# Patient Record
Sex: Female | Born: 2000 | Race: Black or African American | Hispanic: No | Marital: Single | Smoking: Never smoker
Health system: Southern US, Community
[De-identification: ages and names within clinical notes are randomized; demographics above are authoritative.]

## PROBLEM LIST (undated history)

## (undated) ENCOUNTER — Inpatient Hospital Stay (HOSPITAL_COMMUNITY): Payer: Self-pay

## (undated) DIAGNOSIS — D649 Anemia, unspecified: Secondary | ICD-10-CM

## (undated) HISTORY — PX: NO PAST SURGERIES: SHX2092

---

## 2018-08-30 ENCOUNTER — Other Ambulatory Visit: Payer: Self-pay

## 2018-08-30 ENCOUNTER — Encounter (HOSPITAL_BASED_OUTPATIENT_CLINIC_OR_DEPARTMENT_OTHER): Payer: Self-pay | Admitting: Emergency Medicine

## 2018-08-30 ENCOUNTER — Emergency Department (HOSPITAL_BASED_OUTPATIENT_CLINIC_OR_DEPARTMENT_OTHER)
Admission: EM | Admit: 2018-08-30 | Discharge: 2018-08-31 | Disposition: A | Discharge status: Home / Self Care | Payer: Medicaid Other | Attending: Emergency Medicine | Admitting: Emergency Medicine

## 2018-08-30 DIAGNOSIS — O200 Threatened abortion: Secondary | ICD-10-CM | POA: Diagnosis not present

## 2018-08-30 DIAGNOSIS — O26851 Spotting complicating pregnancy, first trimester: Secondary | ICD-10-CM | POA: Diagnosis present

## 2018-08-30 LAB — CBC WITH DIFFERENTIAL/PLATELET
Abs Immature Granulocytes: 0.03 10*3/uL (ref 0.00–0.07)
BASOS PCT: 1 %
Basophils Absolute: 0.1 10*3/uL (ref 0.0–0.1)
EOS ABS: 0.6 10*3/uL — AB (ref 0.0–0.5)
EOS PCT: 6 %
HCT: 38 % (ref 36.0–46.0)
Hemoglobin: 11.8 g/dL — ABNORMAL LOW (ref 12.0–15.0)
Immature Granulocytes: 0 %
Lymphocytes Relative: 33 %
Lymphs Abs: 3.2 10*3/uL (ref 0.7–4.0)
MCH: 25.2 pg — ABNORMAL LOW (ref 26.0–34.0)
MCHC: 31.1 g/dL (ref 30.0–36.0)
MCV: 81 fL (ref 80.0–100.0)
MONO ABS: 0.9 10*3/uL (ref 0.1–1.0)
Monocytes Relative: 9 %
Neutro Abs: 4.9 10*3/uL (ref 1.7–7.7)
Neutrophils Relative %: 51 %
PLATELETS: 339 10*3/uL (ref 150–400)
RBC: 4.69 MIL/uL (ref 3.87–5.11)
RDW: 15 % (ref 11.5–15.5)
WBC: 9.7 10*3/uL (ref 4.0–10.5)
nRBC: 0 % (ref 0.0–0.2)

## 2018-08-30 LAB — URINALYSIS, MICROSCOPIC (REFLEX)

## 2018-08-30 LAB — BASIC METABOLIC PANEL
Anion gap: 10 (ref 5–15)
BUN: 9 mg/dL (ref 6–20)
CALCIUM: 9.2 mg/dL (ref 8.9–10.3)
CO2: 23 mmol/L (ref 22–32)
CREATININE: 0.66 mg/dL (ref 0.44–1.00)
Chloride: 106 mmol/L (ref 98–111)
GFR calc Af Amer: >60 mL/min (ref >60)
Glucose, Bld: 104 mg/dL — ABNORMAL HIGH (ref 70–99)
Potassium: 3.2 mmol/L — ABNORMAL LOW (ref 3.5–5.1)
SODIUM: 139 mmol/L (ref 135–145)

## 2018-08-30 LAB — URINALYSIS, ROUTINE W REFLEX MICROSCOPIC
Bilirubin Urine: NEGATIVE
GLUCOSE, UA: NEGATIVE mg/dL
KETONES UR: NEGATIVE mg/dL
Leukocytes, UA: NEGATIVE
Nitrite: POSITIVE — AB
PH: 6 (ref 5.0–8.0)
PROTEIN: NEGATIVE mg/dL
Specific Gravity, Urine: 1.025 (ref 1.005–1.030)

## 2018-08-30 LAB — HCG, QUANTITATIVE, PREGNANCY: HCG, BETA CHAIN, QUANT, S: 152 m[IU]/mL — AB (ref <5)

## 2018-08-30 NOTE — ED Triage Notes (Signed)
Patient states that she has had vaginal bleeding starting this am  - reports that she has used 2 pads. Patient states that she may have been [redacted] weeks along done by a home pregnancy test. The patient reports that intermittent cramping

## 2018-08-30 NOTE — ED Provider Notes (Signed)
[facility] EMERGENCY DEPARTMENT Provider Note   CSN: [id number] Arrival date & time: 08/30/18  2209     History   Chief Complaint Chief Complaint  Patient presents with  . Vaginal Bleeding    HPI Shannon Costa is a 17 y.o. female.  Patient is a 17 year old female with no significant past medical history.  She is G1 at which she believes to be [redacted] weeks gestation (last menstrual period September 14).  She presents today with complaints of vaginal bleeding.  She noticed this this morning upon wakening.  She has noticed steady, slow trickle of blood since this morning.  She does report some lower abdominal cramping, but no severe discomfort.  She reports taking a home pregnancy test 2 days ago that was positive, however has not been seen by a physician.  The history is provided by the patient.  Vaginal Bleeding  Primary symptoms include vaginal bleeding. There has been no fever. This is a new problem. Episode onset: This morning. The problem occurs constantly. The problem has not changed since onset.She is pregnant. She has missed her period. Her LMP was weeks ago. Pertinent negatives include no diaphoresis, no abdominal pain and no light-headedness. She has tried nothing for the symptoms. The treatment provided no relief.    History reviewed. No pertinent past medical history.  There are no active problems to display for this patient.   History reviewed. No pertinent surgical history.   OB History    Gravida  1   Para      Term      Preterm      AB      Living        SAB      TAB      Ectopic      Multiple      Live Births               Home Medications    Prior to Admission medications   Not on File    Family History History reviewed. No pertinent family history.  Social History Social History   Tobacco Use  . Smoking status: Never Smoker  . Smokeless tobacco: Never Used  Substance Use Topics  . Alcohol use: Never    Frequency:  Never  . Drug use: Never     Allergies   Patient has no known allergies.   Review of Systems Review of Systems  Constitutional: Negative for diaphoresis.  Gastrointestinal: Negative for abdominal pain.  Genitourinary: Positive for vaginal bleeding.  Neurological: Negative for light-headedness.  All other systems reviewed and are negative.    Physical Exam Updated Vital Signs BP (!) 132/92 (BP Location: Left Arm)   Pulse 77   Temp 98.7 F (37.1 C) (Oral)   Resp 16   Ht 5\' 8"  (1.727 m)   Wt 64.9 kg   LMP 07/28/2018   SpO2 100%   BMI 21.74 kg/m   Physical Exam  Constitutional: She is oriented to person, place, and time. She appears well-developed and well-nourished. No distress.  HENT:  Head: Normocephalic and atraumatic.  Neck: Normal range of motion. Neck supple.  Cardiovascular: Normal rate and regular rhythm.  No murmur heard. Pulmonary/Chest: Effort normal and breath sounds normal. No stridor. No respiratory distress. She has no wheezes.  Abdominal: Soft. She exhibits no distension. There is no tenderness.  Neurological: She is alert and oriented to person, place, and time.  Skin: Skin is warm and dry. She is not diaphoretic.  Nursing note and vitals reviewed.    ED Treatments / Results  Labs (all labs ordered are listed, but only abnormal results are displayed) Labs Reviewed  BASIC METABOLIC PANEL  CBC WITH DIFFERENTIAL/PLATELET  HCG, QUANTITATIVE, PREGNANCY  URINALYSIS, ROUTINE W REFLEX MICROSCOPIC  ABO/RH    EKG None  Radiology No results found.  Procedures Procedures (including critical care time)  Medications Ordered in ED Medications - No data to display   Initial Impression / Assessment and Plan / ED Course  I have reviewed the triage vital signs and the nursing notes.  Pertinent labs & imaging results that were available during my care of the patient were reviewed by me and considered in my medical decision making (see chart for  details).  Patient presenting with complaints of vaginal bleeding that started earlier this morning.  She took a pregnancy test at home which was + 2 days ago.  She does report some lower abdominal cramping.  Her beta hCG is 152.  Ultrasound is not here to perform an ultrasound, however she does appear hemodynamically stable.  Her blood counts are adequate.  She will have an ultrasound performed in the morning to rule out ectopic.  Patient will be advised vaginal rest and is to follow-up in the women's outpatient clinic in 3 days for repeat hCG and reevaluation.  Final Clinical Impressions(s) / ED Diagnoses   Final diagnoses:  None    ED Discharge Orders    None       Geoffery Lyons, MD 08/31/18 [phone]

## 2018-08-31 ENCOUNTER — Other Ambulatory Visit (HOSPITAL_BASED_OUTPATIENT_CLINIC_OR_DEPARTMENT_OTHER): Payer: Medicaid Other

## 2018-08-31 ENCOUNTER — Ambulatory Visit (HOSPITAL_BASED_OUTPATIENT_CLINIC_OR_DEPARTMENT_OTHER)
Admission: RE | Admit: 2018-08-31 | Discharge: 2018-08-31 | Disposition: A | Discharge status: Home / Self Care | Payer: Medicaid Other | Source: Ambulatory Visit | Attending: Emergency Medicine | Admitting: Emergency Medicine

## 2018-08-31 ENCOUNTER — Other Ambulatory Visit (HOSPITAL_BASED_OUTPATIENT_CLINIC_OR_DEPARTMENT_OTHER): Payer: Self-pay | Admitting: Emergency Medicine

## 2018-08-31 DIAGNOSIS — Z3A00 Weeks of gestation of pregnancy not specified: Secondary | ICD-10-CM | POA: Diagnosis not present

## 2018-08-31 DIAGNOSIS — O209 Hemorrhage in early pregnancy, unspecified: Secondary | ICD-10-CM | POA: Diagnosis not present

## 2018-08-31 DIAGNOSIS — N939 Abnormal uterine and vaginal bleeding, unspecified: Secondary | ICD-10-CM

## 2018-08-31 NOTE — Discharge Instructions (Signed)
Return tomorrow at the given time for an ultrasound.  You need to follow-up with OB/GYN in the next 3 days.  The contact information for women's outpatient clinic has been provided in this discharge summary for you to call and make these arrangements.

## 2018-08-31 NOTE — ED Provider Notes (Signed)
Ultrasound results reviewed with patient.  Ultrasound does not demonstrate intrauterine pregnancy or ectopic pregnancy.  Her hCG last night was 152.  Patient will require a repeat hCG in 48 hours.  Discussed importance of following up with an OB/GYN doctor.  Patient was given referral to women's outpatient clinic by Dr. Judd Lien  last evening   Linwood Dibbles, MD 08/31/18 1415

## 2018-09-01 LAB — ABO/RH: ABO/RH(D): O POS

## 2018-09-04 ENCOUNTER — Ambulatory Visit: Payer: Medicaid Other

## 2018-09-06 ENCOUNTER — Inpatient Hospital Stay (HOSPITAL_COMMUNITY)
Admission: AD | Admit: 2018-09-06 | Discharge: 2018-09-06 | Disposition: A | Discharge status: Home / Self Care | Payer: Medicaid Other | Source: Ambulatory Visit | Attending: Obstetrics & Gynecology | Admitting: Obstetrics & Gynecology

## 2018-09-06 ENCOUNTER — Encounter (HOSPITAL_COMMUNITY): Payer: Self-pay | Admitting: *Deleted

## 2018-09-06 DIAGNOSIS — O200 Threatened abortion: Secondary | ICD-10-CM

## 2018-09-06 DIAGNOSIS — O039 Complete or unspecified spontaneous abortion without complication: Secondary | ICD-10-CM | POA: Insufficient documentation

## 2018-09-06 DIAGNOSIS — Z3A01 Less than 8 weeks gestation of pregnancy: Secondary | ICD-10-CM | POA: Diagnosis not present

## 2018-09-06 DIAGNOSIS — O209 Hemorrhage in early pregnancy, unspecified: Secondary | ICD-10-CM | POA: Diagnosis present

## 2018-09-06 LAB — HCG, QUANTITATIVE, PREGNANCY: HCG, BETA CHAIN, QUANT, S: 12 m[IU]/mL — AB (ref <5)

## 2018-09-06 NOTE — MAU Note (Addendum)
Shannon Costa is a 17 y.o. at [redacted]w[redacted]d here in MAU reporting: for follow up HCG. Was seen in the ED on 10/19 and was told to follow up. Patient states she was unable to make it to clinic 3 days later as instructed by the ED. Denies any vaginal bleeding or pain today. Reports that she is doing well. Discussed with Sam CNM. Patient will go to lobby and await lab draw.  Vitals:   09/06/18 1359  BP: 124/76  Pulse: 70  Resp: 16  Temp: 98.1 F (36.7 C)  SpO2: 100%

## 2018-09-06 NOTE — MAU Provider Note (Signed)
  History     CSN: [id number]  Arrival date and time: 09/06/18 1345   None     Chief Complaint  Patient presents with  . Follow-up   HPI  Shanoah A Swenor is a 17 y.o. G1P0 at [redacted]w[redacted]d with pregnancy of unknown viability. She reports to MAU for repeat Quant hCG. Patient reports recent history of heavy vaginal bleeding and bilateral low abdominal cramping, both resolved within the past 48 hours. Patient was seen at [facility] ED 08/31/18 for heavy vaginal bleeding in the setting of two positive home pregnancy tests. Denies vaginal bleeding, abdominal pain, abnormal vaginal discharge, SOB, fever, falls, or recent illness.    OB History    Gravida  1   Para      Term      Preterm      AB      Living        SAB      TAB      Ectopic      Multiple      Live Births              No past medical history on file.  No past surgical history on file.  No family history on file.  Social History   Tobacco Use  . Smoking status: Never Smoker  . Smokeless tobacco: Never Used  Substance Use Topics  . Alcohol use: Never    Frequency: Never  . Drug use: Never    Allergies: No Known Allergies  No medications prior to admission.    Review of Systems  Constitutional: Negative for fatigue and fever.  Gastrointestinal: Negative for abdominal pain, nausea and vomiting.  Genitourinary: Negative for difficulty urinating, dyspareunia, vaginal bleeding, vaginal discharge and vaginal pain.  Musculoskeletal: Negative for back pain.  Neurological: Negative for dizziness, weakness and headaches.  All other systems reviewed and are negative.  Physical Exam   Blood pressure 124/76, pulse 70, temperature 98.1 F (36.7 C), temperature source Oral, resp. rate 16, weight 60.8 kg, last menstrual period 07/28/2018, SpO2 100 %.  Physical Exam  Nursing note and vitals reviewed. Constitutional: She is oriented to person, place, and time. She appears well-developed and  well-nourished.  Cardiovascular: Normal rate.  Respiratory: Effort normal.  GI: Soft. She exhibits no distension. There is no tenderness. There is no rebound, no guarding and no CVA tenderness.  Musculoskeletal: Normal range of motion.  Neurological: She is alert and oriented to person, place, and time.  Skin: Skin is warm and dry.  Psychiatric: She has a normal mood and affect. Her behavior is normal. Judgment and thought content normal.    MAU Course  Procedures  MDM  --Likely complete miscarriage since triage 08/31/18 due to low hCG 08/31/09 and resolution of bleeding and abdominal pain.   Patient Vitals for the past 24 hrs:  BP Temp Temp src Pulse Resp SpO2 Weight  09/06/18 1359 124/76 98.1 F (36.7 C) Oral 70 16 100 % 60.8 kg     Assessment and Plan  --17 y.o. G1P0 with pregnancy of unknown viability --Patient requesting discharge prior to Quant hCG result due to transportation issues --Confirmed contact phone number in chart, will call with result to discuss next steps --Hemodynamically stable, no physical complaints or concerns --Discharge home in stable condition  Calvert Cantor, CNM 09/06/2018, 2:45 PM

## 2018-09-06 NOTE — Progress Notes (Addendum)
Clinic messaged to schedule follow-up appt for Friday Nov 1. I called patient, discussed hCG result and need for follow-up in clinic. Patient verbalized understanding.

## 2018-09-06 NOTE — Discharge Instructions (Signed)
Threatened Miscarriage °A threatened miscarriage is when you have vaginal bleeding during your first 20 weeks of pregnancy but the pregnancy has not ended. Your doctor will do tests to make sure you are still pregnant. The cause of the bleeding may not be known. This condition does not mean your pregnancy will end. It does increase the risk of it ending (complete miscarriage). °Follow these instructions at home: °· Make sure you keep all your doctor visits for prenatal care. °· Get plenty of rest. °· Do not have sex or use tampons if you have vaginal bleeding. °· Do not douche. °· Do not smoke or use drugs. °· Do not drink alcohol. °· Avoid caffeine. °Contact a doctor if: °· You have light bleeding from your vagina. °· You have belly pain or cramping. °· You have a fever. °Get help right away if: °· You have heavy bleeding from your vagina. °· You have clots of blood coming from your vagina. °· You have bad pain or cramps in your low back or belly. °· You have fever, chills, and bad belly pain. °This information is not intended to replace advice given to you by your health care provider. Make sure you discuss any questions you have with your health care provider. °Document Released: 10/11/2008 Document Revised: 04/05/2016 Document Reviewed: 08/25/2013 °Elsevier Interactive Patient Education © 2018 [employer] ° °

## 2018-09-12 ENCOUNTER — Other Ambulatory Visit: Payer: Self-pay | Admitting: *Deleted

## 2018-09-12 ENCOUNTER — Other Ambulatory Visit: Payer: Medicaid Other

## 2018-09-12 DIAGNOSIS — O039 Complete or unspecified spontaneous abortion without complication: Secondary | ICD-10-CM

## 2018-09-12 NOTE — Progress Notes (Signed)
bhcg

## 2018-10-01 ENCOUNTER — Telehealth: Payer: Self-pay | Admitting: *Deleted

## 2018-10-01 ENCOUNTER — Other Ambulatory Visit: Payer: Medicaid Other

## 2018-10-01 DIAGNOSIS — O039 Complete or unspecified spontaneous abortion without complication: Secondary | ICD-10-CM

## 2018-10-01 NOTE — Telephone Encounter (Signed)
Shannon Costa called and asked to speak to a nurse- call transferred to nurse.  She states she never came in for bloodwork and had some period like pain 2 days ago, none now. States has not gotten her period.  Denies pain today. Per chart had presumbed miscarriage and was supposed to come for non stat bhcg 09/12/18. Advised to come today , she agreed to 11am.. I also reviewed ectopic precautions with her . She voices  Understanding.

## 2018-10-02 ENCOUNTER — Telehealth: Payer: Self-pay | Admitting: *Deleted

## 2018-10-02 ENCOUNTER — Other Ambulatory Visit: Payer: Self-pay | Admitting: Advanced Practice Midwife

## 2018-10-02 DIAGNOSIS — O3680X0 Pregnancy with inconclusive fetal viability, not applicable or unspecified: Secondary | ICD-10-CM

## 2018-10-02 LAB — BETA HCG QUANT (REF LAB): HCG QUANT: 4271 m[IU]/mL

## 2018-10-02 NOTE — Progress Notes (Signed)
Reviewed Quant hCG results from yesterday. Patient requires additional dose of 800 mcg Cytotec. [facility] to please coordinate and notify patient.  Clayton BiblesSamantha Johna Kearl, CNM 10/02/18 7:10 AM

## 2018-10-02 NOTE — Telephone Encounter (Signed)
[facility] patient needs to repeat cytotec.  And get stat bhcg Saturday. Received call from patient stating she is wanting results and thinks she has UTI because she had pain in lower back +8 2 days ago , none now . I also asked her is she had taken cytotec  ( I could not verify she had taken any in chart )Reviewed chart and patient complaint  with Dr. Alysia PennaErvin and orders to cancel cytotec. Ordered repeat US asap. Scheduled US for first available appointment and called patient with appointment.  Also instructed her to come to our office after hours tonight for us to check her urine for uti. Also advised her to go to mau for severe pain or heavy bleeding. She voices understanding.

## 2018-10-07 ENCOUNTER — Ambulatory Visit (HOSPITAL_COMMUNITY): Payer: Medicaid Other

## 2018-10-16 ENCOUNTER — Ambulatory Visit (HOSPITAL_COMMUNITY)
Admission: RE | Admit: 2018-10-16 | Discharge: 2018-10-16 | Disposition: A | Discharge status: Home / Self Care | Payer: BLUE CROSS/BLUE SHIELD | Source: Ambulatory Visit | Attending: Obstetrics and Gynecology | Admitting: Obstetrics and Gynecology

## 2018-10-16 ENCOUNTER — Other Ambulatory Visit: Payer: Self-pay | Admitting: Obstetrics & Gynecology

## 2018-10-16 ENCOUNTER — Encounter: Payer: Self-pay | Admitting: Internal Medicine

## 2018-10-16 ENCOUNTER — Ambulatory Visit (INDEPENDENT_AMBULATORY_CARE_PROVIDER_SITE_OTHER): Payer: Medicaid Other | Admitting: *Deleted

## 2018-10-16 DIAGNOSIS — Z3A01 Less than 8 weeks gestation of pregnancy: Secondary | ICD-10-CM | POA: Diagnosis not present

## 2018-10-16 DIAGNOSIS — O219 Vomiting of pregnancy, unspecified: Secondary | ICD-10-CM | POA: Insufficient documentation

## 2018-10-16 DIAGNOSIS — O3680X0 Pregnancy with inconclusive fetal viability, not applicable or unspecified: Secondary | ICD-10-CM | POA: Diagnosis present

## 2018-10-16 MED ORDER — ONDANSETRON 4 MG PO TBDP: 4.0000 mg | ORAL_TABLET | Freq: Four times a day (QID) | ORAL | 0 refills | Status: DC | PRN

## 2018-10-16 NOTE — Progress Notes (Signed)
Here for results US. REviewed with Dr. Vergie LivingPickens and notified patient US shows live baby 142w5d and we recommend she start prenatal care and prenatal vitamins. She also asked for nausea med. Recommended unison and Vitamin B6. If that does not work- to notify her ob provider she has chosen.  Linda,RN

## 2018-10-16 NOTE — Patient Instructions (Signed)
May take Unisom( doxylamine succinate 25mg - no gel caps) take 1 tablet at bedtime  Also take Vitamin B 6 100mg  twice a day for morning sickness

## 2018-10-20 NOTE — Progress Notes (Signed)
I have reviewed the chart and agree with nursing staff's documentation of this patient's encounter.  Shannon Salley, MD 10/20/2018 1:05 PM    

## 2018-12-23 ENCOUNTER — Encounter (HOSPITAL_COMMUNITY): Payer: Self-pay | Admitting: *Deleted

## 2018-12-23 ENCOUNTER — Other Ambulatory Visit: Payer: Self-pay | Admitting: Obstetrics and Gynecology

## 2018-12-23 ENCOUNTER — Inpatient Hospital Stay (HOSPITAL_COMMUNITY)
Admission: AD | Admit: 2018-12-23 | Discharge: 2018-12-23 | Disposition: A | Discharge status: Home / Self Care | Payer: Medicaid Other | Attending: Obstetrics & Gynecology | Admitting: Obstetrics & Gynecology

## 2018-12-23 DIAGNOSIS — O039 Complete or unspecified spontaneous abortion without complication: Secondary | ICD-10-CM | POA: Insufficient documentation

## 2018-12-23 DIAGNOSIS — D649 Anemia, unspecified: Secondary | ICD-10-CM | POA: Diagnosis not present

## 2018-12-23 DIAGNOSIS — Z3A16 16 weeks gestation of pregnancy: Secondary | ICD-10-CM | POA: Insufficient documentation

## 2018-12-23 DIAGNOSIS — O200 Threatened abortion: Secondary | ICD-10-CM | POA: Diagnosis not present

## 2018-12-23 DIAGNOSIS — O219 Vomiting of pregnancy, unspecified: Secondary | ICD-10-CM

## 2018-12-23 DIAGNOSIS — Z8759 Personal history of other complications of pregnancy, childbirth and the puerperium: Secondary | ICD-10-CM

## 2018-12-23 HISTORY — DX: Anemia, unspecified: D64.9

## 2018-12-23 LAB — CBC
HCT: 29.9 % — ABNORMAL LOW (ref 36.0–46.0)
Hemoglobin: 9.4 g/dL — ABNORMAL LOW (ref 12.0–15.0)
MCH: 24 pg — ABNORMAL LOW (ref 26.0–34.0)
MCHC: 31.4 g/dL (ref 30.0–36.0)
MCV: 76.5 fL — ABNORMAL LOW (ref 80.0–100.0)
Platelets: 469 10*3/uL — ABNORMAL HIGH (ref 150–400)
RBC: 3.91 MIL/uL (ref 3.87–5.11)
RDW: 14.5 % (ref 11.5–15.5)
WBC: 5.7 10*3/uL (ref 4.0–10.5)
nRBC: 0 % (ref 0.0–0.2)

## 2018-12-23 LAB — URINALYSIS, ROUTINE W REFLEX MICROSCOPIC
BILIRUBIN URINE: NEGATIVE
Glucose, UA: NEGATIVE mg/dL
KETONES UR: NEGATIVE mg/dL
Leukocytes,Ua: NEGATIVE
Nitrite: NEGATIVE
Protein, ur: 30 mg/dL — AB
Specific Gravity, Urine: 1.025 (ref 1.005–1.030)
pH: 5.5 (ref 5.0–8.0)

## 2018-12-23 LAB — URINALYSIS, MICROSCOPIC (REFLEX)

## 2018-12-23 LAB — HCG, QUANTITATIVE, PREGNANCY: HCG, BETA CHAIN, QUANT, S: 26 m[IU]/mL — AB (ref <5)

## 2018-12-23 LAB — POCT PREGNANCY, URINE: PREG TEST UR: POSITIVE — AB

## 2018-12-23 MED ORDER — FERROUS SULFATE 325 (65 FE) MG PO TABS: 325.0000 mg | ORAL_TABLET | Freq: Two times a day (BID) | ORAL | 3 refills | Status: AC

## 2018-12-23 NOTE — MAU Note (Signed)
Pt states she thinks she had a miscarriage. States she started bleeding in mid to late January and thinks she passed the baby then. States she wasn't sure if/when she needed to come . Bleeding stopped and pain stopped

## 2018-12-23 NOTE — MAU Provider Note (Signed)
History     CSN: [id number]  Arrival date and time: 12/23/18 1546   First Provider Initiated Contact with Patient 12/23/18 1641      Chief Complaint  Patient presents with  . Threatened Miscarriage   HPI  Ms.  Shannon Costa is a 18 y.o. year old G1P0 female at [redacted]w[redacted]d weeks gestation who presents to MAU reporting she thinks she had a miscarriage on 11/16/2018. She states she was bleeding and had lots of abdominal pain at that time when she passed what looked like a large round, fluid-filled circle in a toilet filled with blood. She did not take a pic of what was in the toilet. She states she had a period since the ? SAB occurred (LMP on 12/17/2018 to 12/22/2018 - normal). She has no complaints of pain or VB today. She is also concerned that she is anemic, because she "always feels tired." She is requesting to be checked out and see if she needs to do anything.  Past Medical History:  Diagnosis Date  . Anemia     Past Surgical History:  Procedure Laterality Date  . NO PAST SURGERIES      History reviewed. No pertinent family history.  Social History   Tobacco Use  . Smoking status: Never Smoker  . Smokeless tobacco: Never Used  Substance Use Topics  . Alcohol use: Never    Frequency: Never  . Drug use: Never    Allergies: No Known Allergies  No medications prior to admission.    Review of Systems  Constitutional: Positive for fatigue.  HENT: Negative.   Eyes: Negative.   Respiratory: Negative.   Cardiovascular: Negative.   Gastrointestinal: Negative.   Endocrine: Negative.   Genitourinary: Negative.   Musculoskeletal: Negative.   Skin: Negative.   Allergic/Immunologic: Negative.   Neurological: Negative.   Hematological: Negative.   Psychiatric/Behavioral: Negative.    Physical Exam   Blood pressure 128/71, pulse 76, temperature 98.1 F (36.7 C), resp. rate 16, height 5\' 8"  (1.727 m), weight 59 kg, last menstrual period 07/28/2018, SpO2 100 %.  Physical  Exam  Nursing note and vitals reviewed. Constitutional: She is oriented to person, place, and time. She appears well-developed and well-nourished.  HENT:  Head: Normocephalic and atraumatic.  Eyes: Pupils are equal, round, and reactive to light.  Neck: Normal range of motion.  Cardiovascular: Normal rate, regular rhythm and normal heart sounds.  Respiratory: Effort normal.  GI: Soft. Bowel sounds are normal.  Genitourinary:    Genitourinary Comments: Pelvic deferred   Musculoskeletal: Normal range of motion.  Neurological: She is alert and oriented to person, place, and time.  Skin: Skin is warm and dry.  Psychiatric: She has a normal mood and affect. Her behavior is normal. Judgment and thought content normal.    MAU Course  Procedures  MDM [facility] UPT HCG CBC  Results for orders placed or performed during the hospital encounter of 12/23/18 (from the past 24 hour(s))  Pregnancy, urine POC     Status: Abnormal   Collection Time: 12/23/18  5:16 PM  Result Value Ref Range   Preg Test, Ur POSITIVE (A) NEGATIVE  CBC     Status: Abnormal   Collection Time: 12/23/18  5:18 PM  Result Value Ref Range   WBC 5.7 4.0 - 10.5 K/uL   RBC 3.91 3.87 - 5.11 MIL/uL   Hemoglobin 9.4 (L) 12.0 - 15.0 g/dL   HCT 65.6 (L) 81.2 - 75.1 %   MCV 76.5 (L) 80.0 -  100.0 fL   MCH 24.0 (L) 26.0 - 34.0 pg   MCHC 31.4 30.0 - 36.0 g/dL   RDW 16.114.5 09.611.5 - 04.515.5 %   Platelets 469 (H) 150 - 400 K/uL   nRBC 0.0 0.0 - 0.2 %   **Patient had to leave to pick up mother. Advised that CNM would call her with results of HCG and she may be requested to return to MAU for U/S, if HCG is still really high. Patient assures CNM that she can return to MAU, if she is requested to return to MAU.   Assessment and Plan  Miscarriage  - TC to patient with HCG results - Explained to patient that since previous HCG was 4,271 and HCG from today's visit is 26, that she has had a miscarriage - Information provided on threatened  miscarriage (since miscarriage not verified at the time of d/c)  - F/U HCG appt scheduled for 1 week on 12/30/2018 @ 1400 -- message sent to have patient seen by provider in 2 wks.  Anemia - Rx for FeSO4 1 tab BID - Discharge patient - Patient verbalized an understanding of the plan of care and agrees.      Raelyn Moraolitta Ferol Laiche, MSN, CNM 12/23/2018, 4:41 PM

## 2018-12-23 NOTE — Progress Notes (Signed)
TC to notify pt of HCG level that dropped from 4,271 to 26. Explained to her why UPT was (+). Scheduled for F/U HCG on 12/30/2018. Msg sent to have patient see provider in 2 wks for F/U to SAB. Patient verbalized an understanding of the plan of care and agrees.   Raelyn Mora, CNM

## 2018-12-30 ENCOUNTER — Ambulatory Visit: Payer: Medicaid Other

## 2019-08-29 ENCOUNTER — Encounter (HOSPITAL_COMMUNITY): Payer: Self-pay

## 2019-11-29 IMAGING — US US OB TRANSVAGINAL
1 series · 15 of 28 positions shown · non-contrast
Comparison: 08/31/2018

CLINICAL DATA: Follow-up 1st trimester pregnancy of unknown
anatomic location. Unsure of LMP.

EXAM:
TRANSVAGINAL OB ULTRASOUND
TECHNIQUE: Transvaginal ultrasound was performed for complete evaluation of the
gestation as well as the maternal uterus, adnexal regions, and
pelvic cul-de-sac.

[Series 1: us ob transvaginal · 41 acquisitions, 15 frames shown]
[im 1/41]
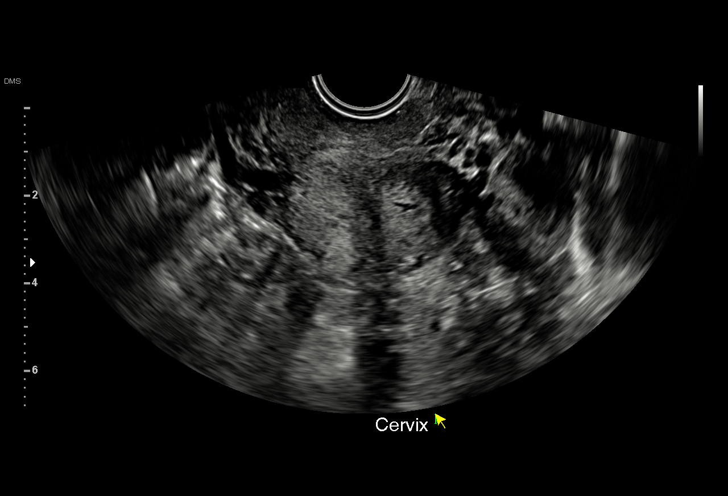
[im 3/41]
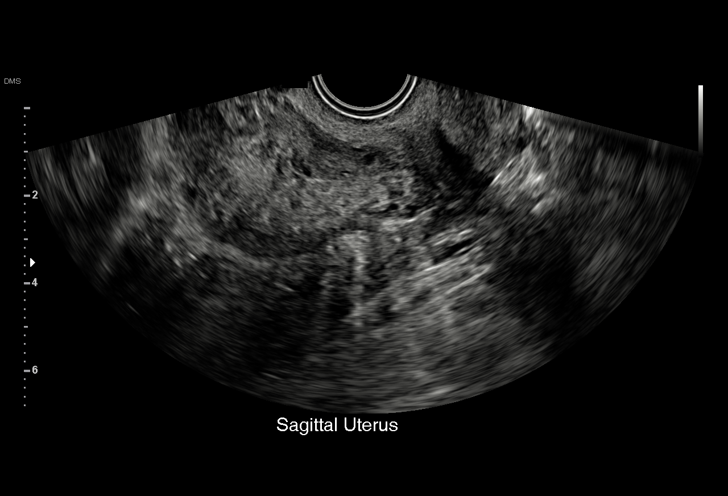
[im 6/41]
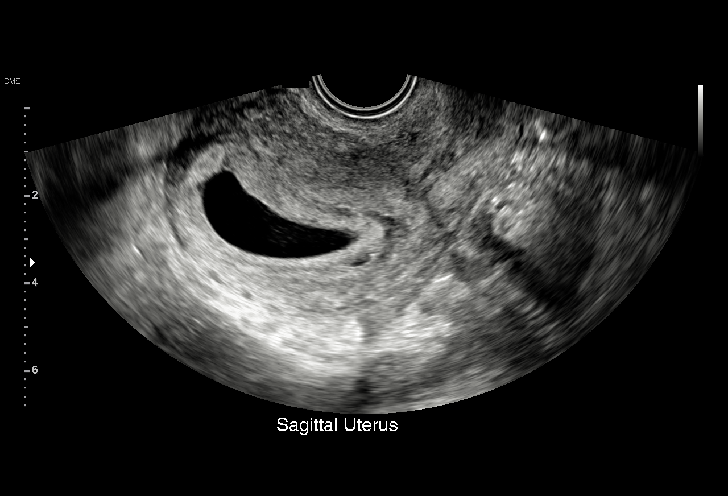
[im 9/41]
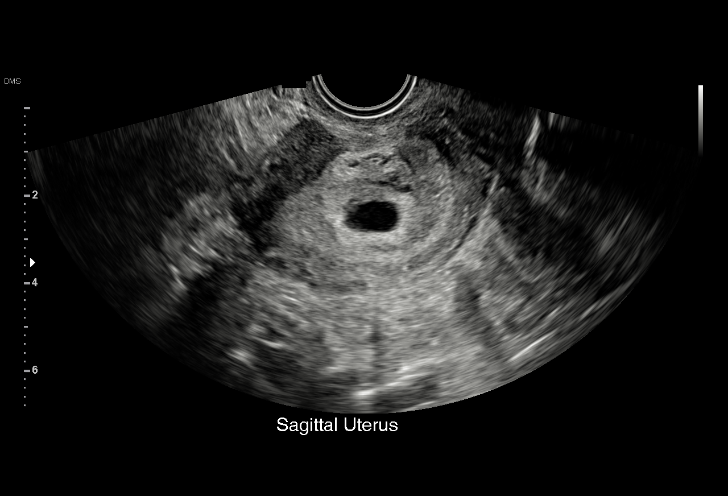
[im 12/41]
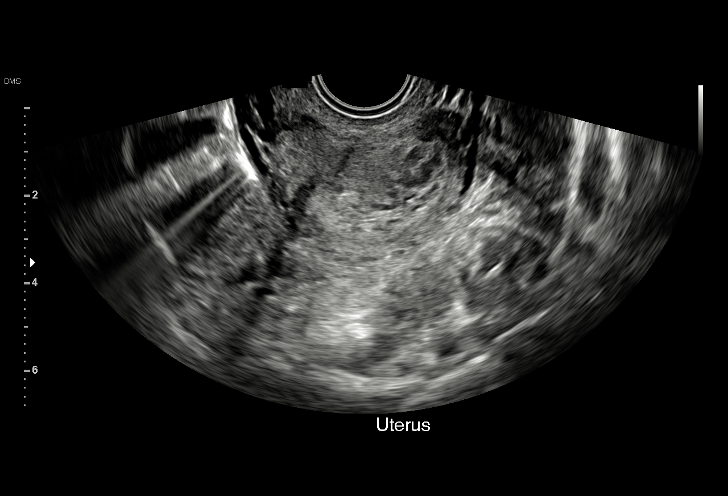
[im 15/41]
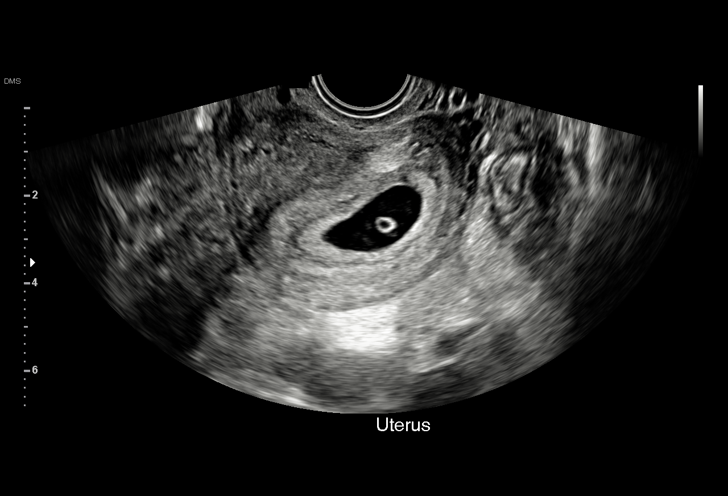
[im 18/41]
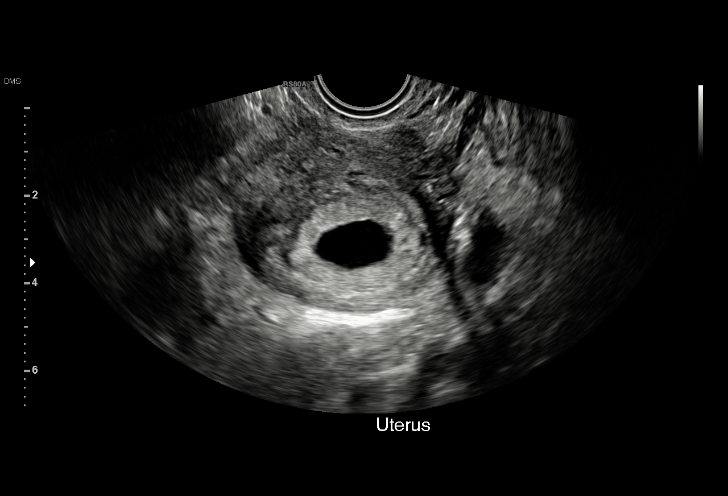
[im 21/41]
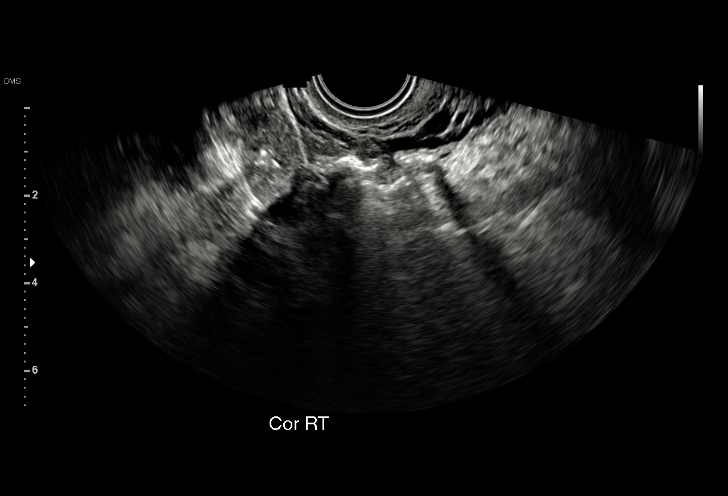
[im 23/41]
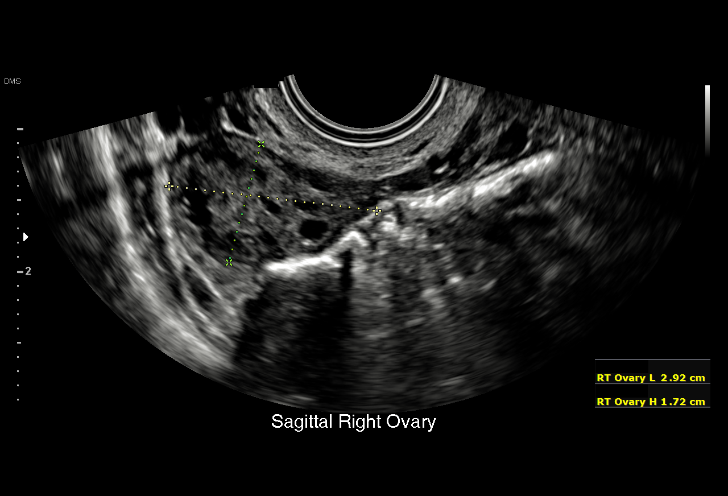
[im 26/41]
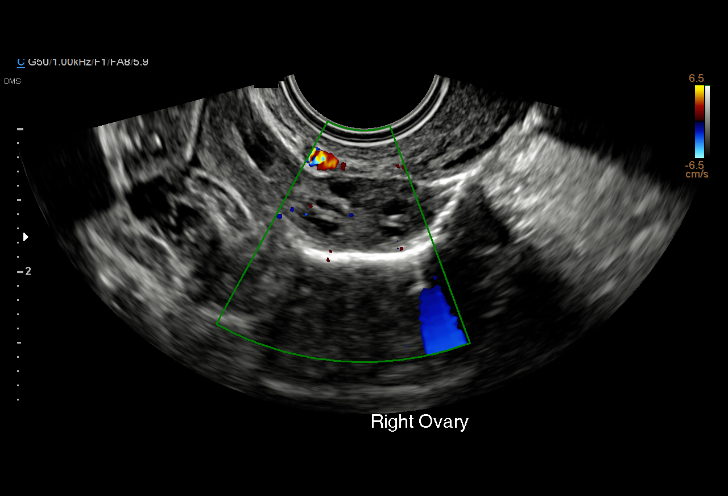
[im 29/41]
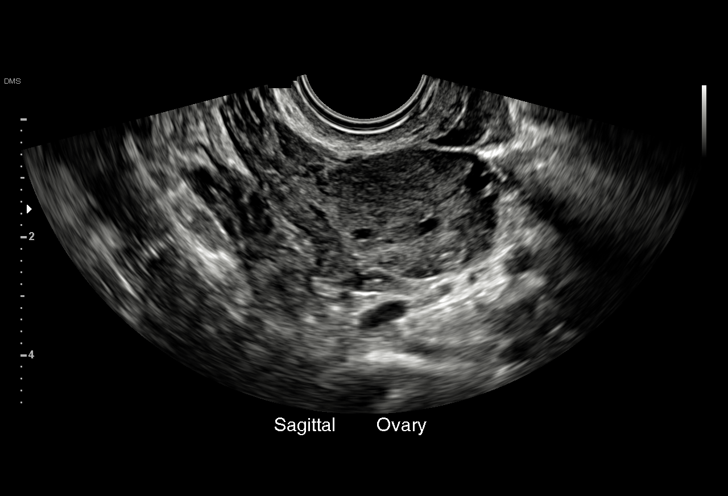
[im 32/41]
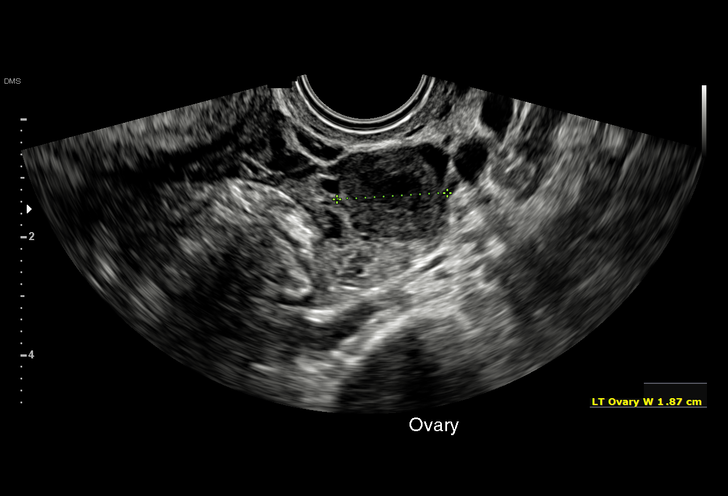
[im 35/41]
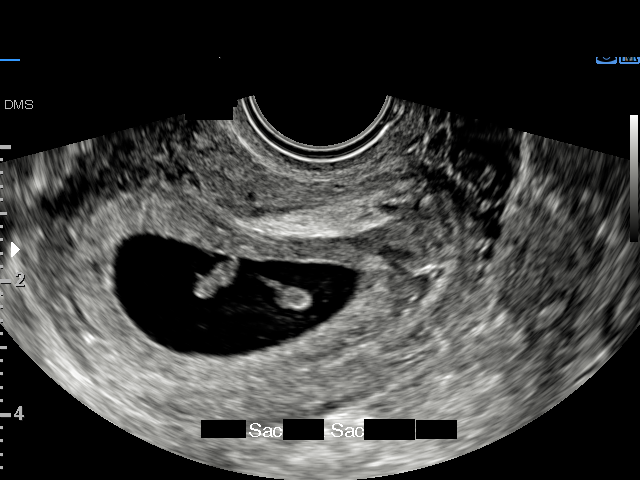
[im 38/41]
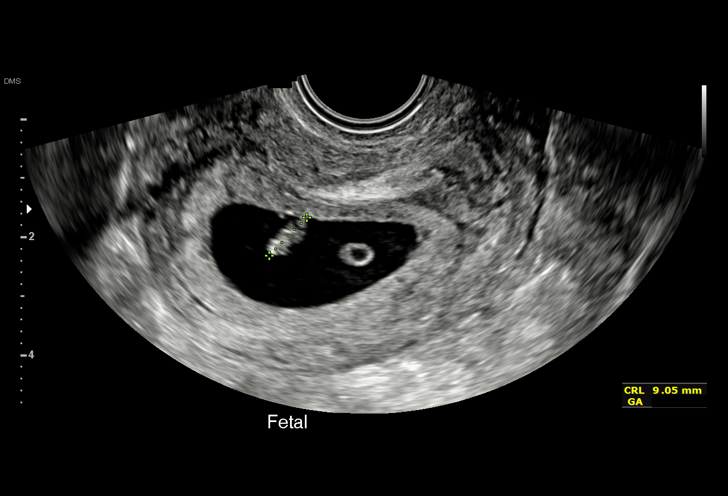
[im 41/41]
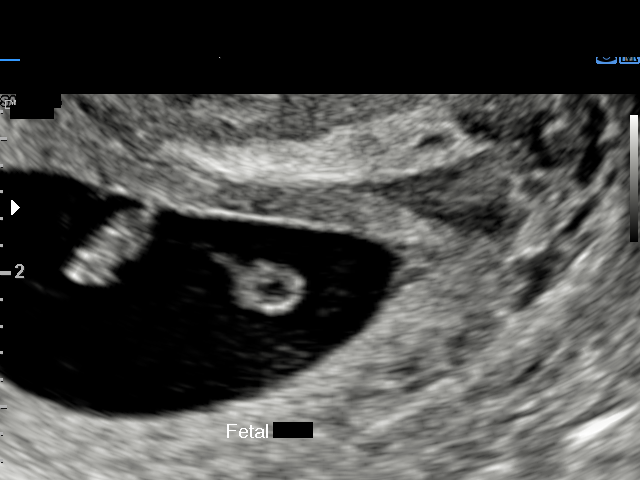

[15 of 28 positions shown; findings below may reference images not displayed]

FINDINGS: Intrauterine gestational sac: Single

Yolk sac:  Visualized.

Embryo:  Visualized.

Cardiac Activity: Visualized.

Heart Rate: 135 bpm

CRL:   9 mm   6 w 5 d                  US EDC: 06/06/2019

Subchorionic hemorrhage:  None visualized.

Maternal uterus/adnexae: Both ovaries are normal in appearance. No
mass or abnormal free fluid identified.
IMPRESSION: Single living IUP measuring 6 weeks 5 days, with US EDC of
06/06/2019.

No significant maternal uterine or adnexal abnormality identified.

## 2020-05-11 IMAGING — US US OB < 14 WEEKS - US OB TV
1 series · 14 of 28 positions shown · non-contrast
Comparison: None.

CLINICAL DATA: First trimester pregnancy and vaginal bleeding

EXAM:
OBSTETRIC <14 WK US AND TRANSVAGINAL OB US
TECHNIQUE: Both transabdominal and transvaginal ultrasound examinations were
performed for complete evaluation of the gestation as well as the
maternal uterus, adnexal regions, and pelvic cul-de-sac.
Transvaginal technique was performed to assess early pregnancy.

[Series 1: us ob < 14 weeks - us ob tv · 0.18mm/px · 37 acquisitions, 14 frames shown]
[im 2/37]
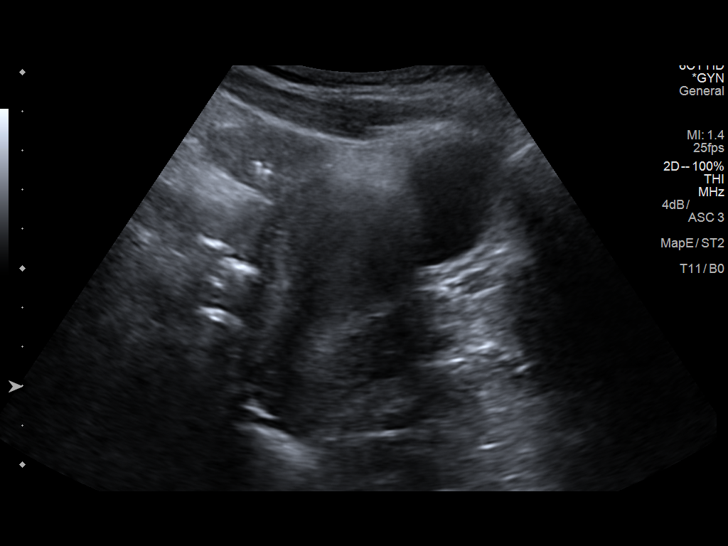
[im 5/37]
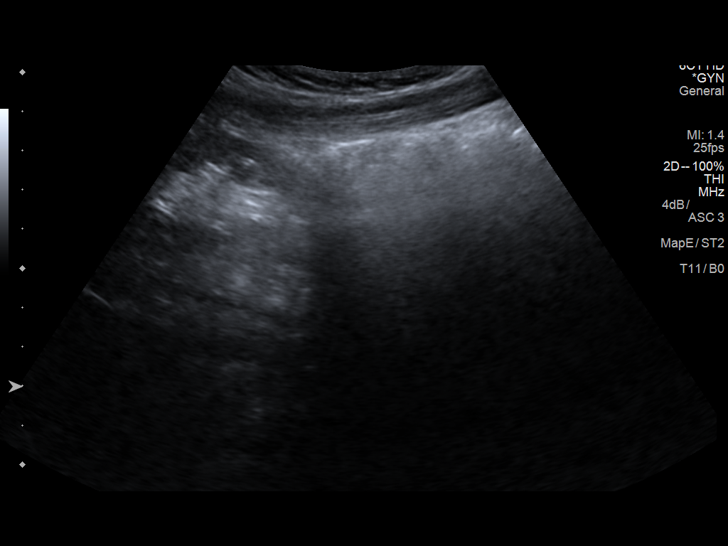
[im 7/37]
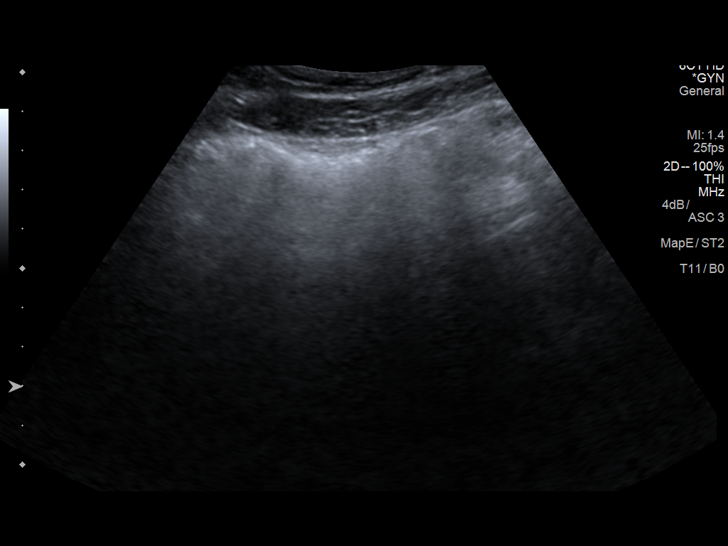
[im 10/37]
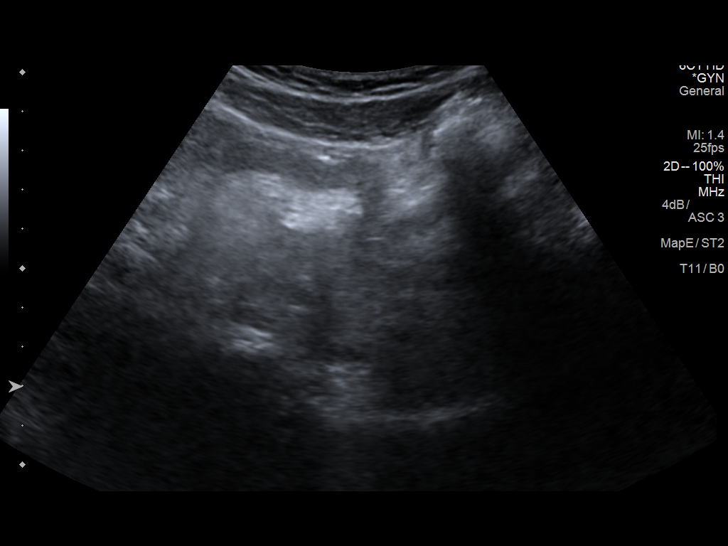
[im 13/37]
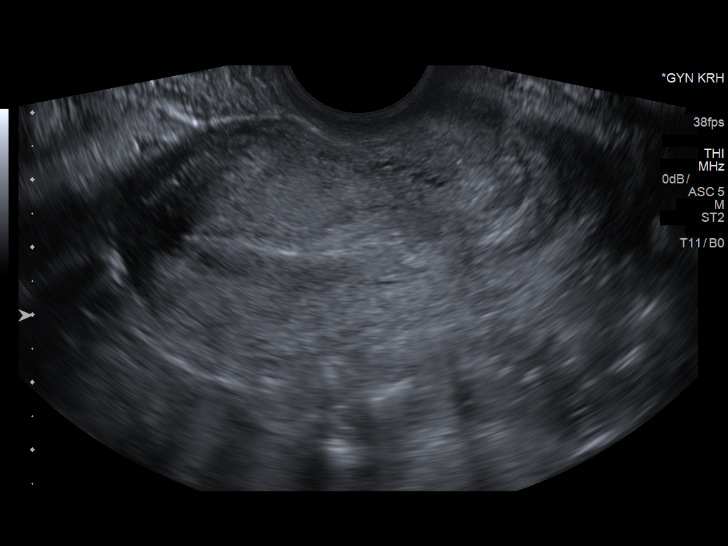
[im 15/37]
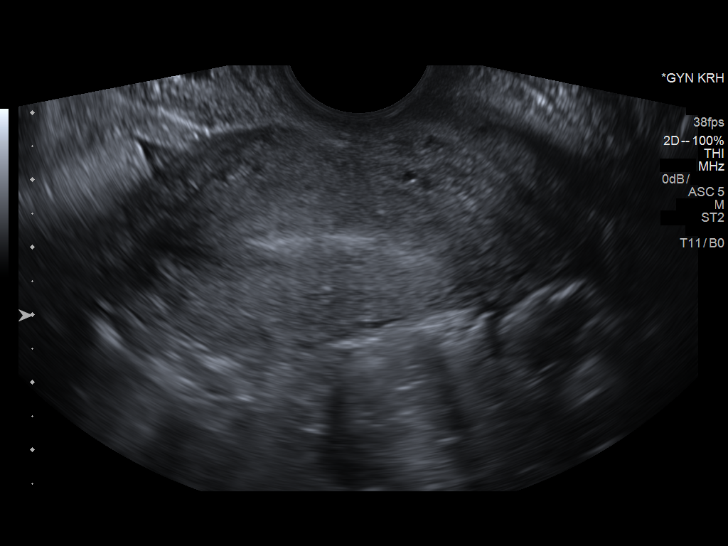
[im 18/37]
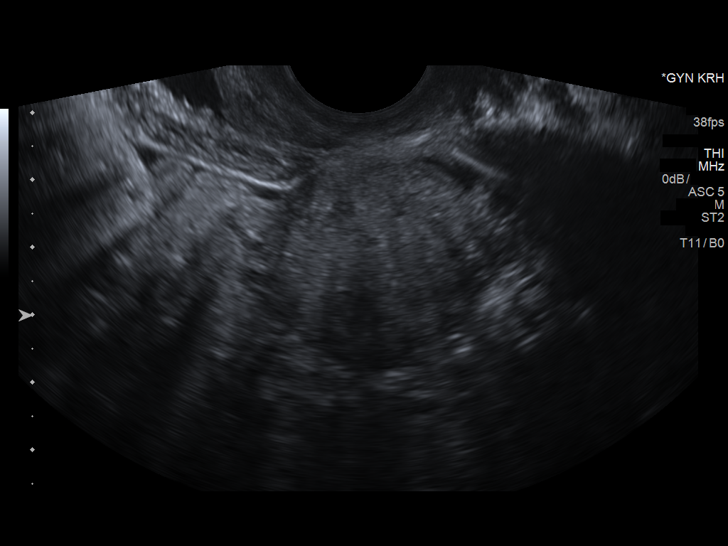
[im 21/37]
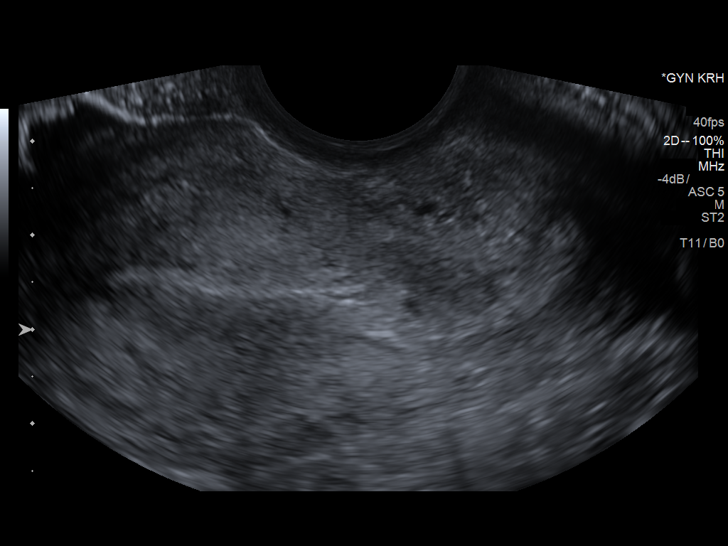
[im 23/37]
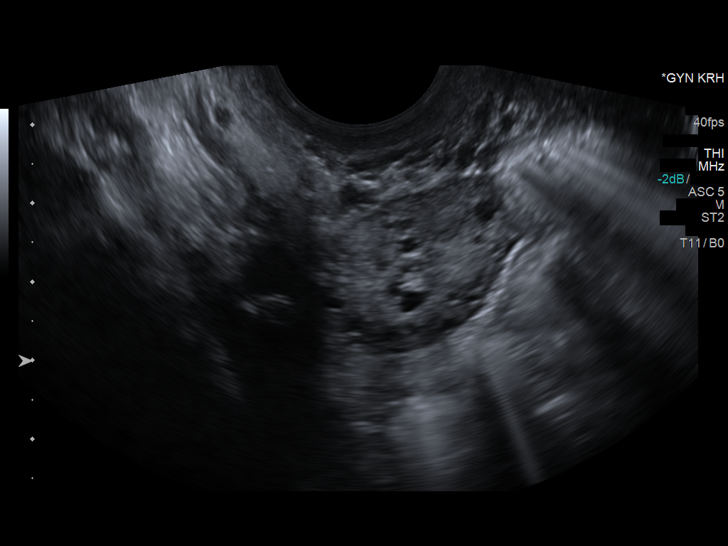
[im 26/37]
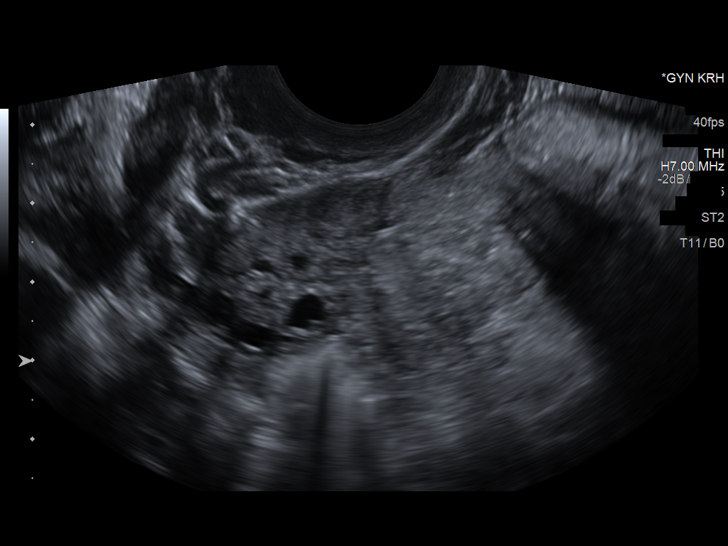
[im 29/37]
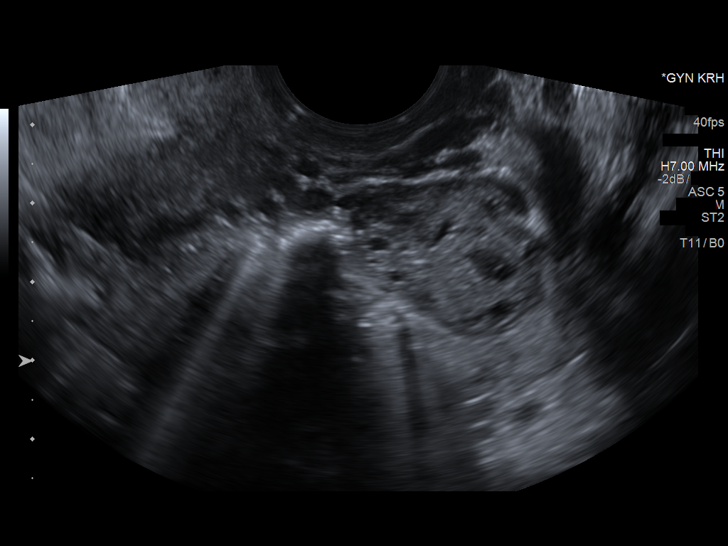
[im 31/37]
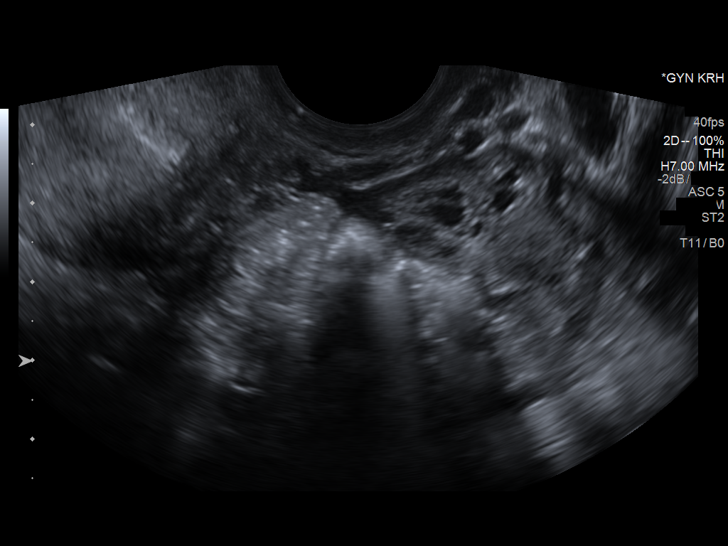
[im 34/37]
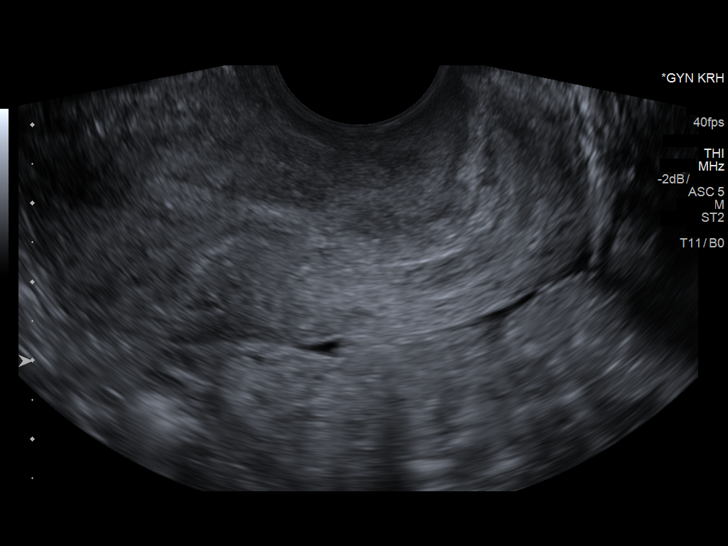
[im 37/37]
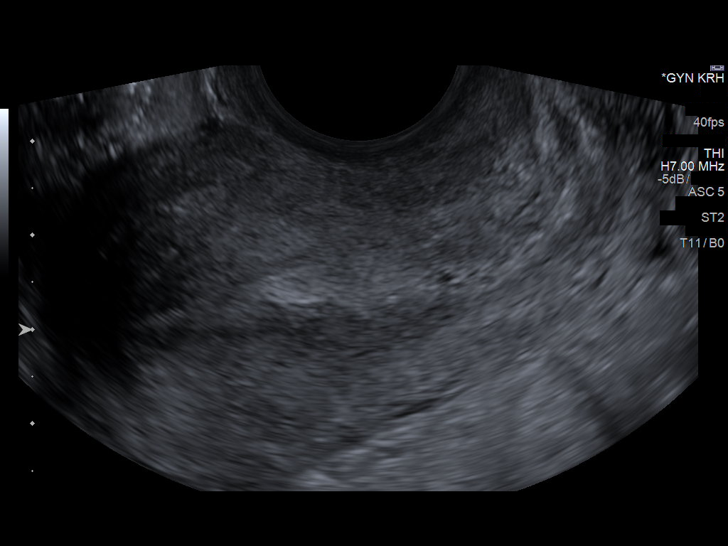

[14 of 28 positions shown; findings below may reference images not displayed]

FINDINGS: No evidence of intra or extrauterine gestational sac. No adnexal
mass. There is trace simple appearing pelvic fluid. Normal
appearance of the ovaries and uterus. Mobile material is seen within
the endometrial cavity.
IMPRESSION: 1. Pregnancy of unknown location. Differential considerations
include intrauterine gestation too early to be sonographically
visualized, spontaneous abortion, or ectopic pregnancy. Consider
follow-up ultrasound in 10 days and serial quantitative beta HCG
follow-up.
2. Mobile material in the endometrial cavity compatible with
bleeding.
